# Patient Record
Sex: Male | Born: 1965 | Race: White | Hispanic: No | Marital: Married
Health system: Southern US, Community
[De-identification: ages and names within clinical notes are randomized; demographics above are authoritative.]

---

## 2006-10-30 LAB — CONVERTED CEMR LAB
Cholesterol: 234 mg/dL
HDL: 45 mg/dL
LDL Cholesterol: 160 mg/dL
Triglycerides: 145 mg/dL

## 2006-11-24 ENCOUNTER — Encounter: Payer: Self-pay | Admitting: Family Medicine

## 2006-11-25 ENCOUNTER — Ambulatory Visit: Payer: Self-pay | Admitting: Family Medicine

## 2006-11-25 DIAGNOSIS — E785 Hyperlipidemia, unspecified: Secondary | ICD-10-CM | POA: Insufficient documentation

## 2006-11-25 DIAGNOSIS — G4709 Other insomnia: Secondary | ICD-10-CM | POA: Insufficient documentation

## 2006-11-25 DIAGNOSIS — R9431 Abnormal electrocardiogram [ECG] [EKG]: Secondary | ICD-10-CM | POA: Insufficient documentation

## 2006-11-25 LAB — CONVERTED CEMR LAB
Cholesterol, target level: 200 mg/dL
LDL Goal: 160 mg/dL

## 2007-11-12 ENCOUNTER — Ambulatory Visit: Payer: Self-pay | Admitting: Occupational Medicine

## 2007-11-16 ENCOUNTER — Telehealth (INDEPENDENT_AMBULATORY_CARE_PROVIDER_SITE_OTHER): Payer: Self-pay | Admitting: Occupational Medicine

## 2007-11-19 ENCOUNTER — Telehealth (INDEPENDENT_AMBULATORY_CARE_PROVIDER_SITE_OTHER): Payer: Self-pay | Admitting: Occupational Medicine

## 2007-11-19 ENCOUNTER — Ambulatory Visit: Payer: Self-pay | Admitting: Occupational Medicine

## 2008-07-16 ENCOUNTER — Ambulatory Visit: Payer: Self-pay | Admitting: Family Medicine

## 2008-07-16 DIAGNOSIS — S335XXA Sprain of ligaments of lumbar spine, initial encounter: Secondary | ICD-10-CM

## 2008-07-16 DIAGNOSIS — S339XXA Sprain of unspecified parts of lumbar spine and pelvis, initial encounter: Secondary | ICD-10-CM | POA: Insufficient documentation

## 2008-07-20 ENCOUNTER — Telehealth (INDEPENDENT_AMBULATORY_CARE_PROVIDER_SITE_OTHER): Payer: Self-pay

## 2008-07-20 ENCOUNTER — Ambulatory Visit: Payer: Self-pay | Admitting: Family Medicine

## 2010-01-04 ENCOUNTER — Encounter: Admission: RE | Admit: 2010-01-04 | Discharge: 2010-01-04 | Payer: Self-pay | Admitting: Emergency Medicine

## 2010-10-03 ENCOUNTER — Encounter: Payer: Self-pay | Admitting: Emergency Medicine

## 2010-10-03 ENCOUNTER — Other Ambulatory Visit: Payer: Self-pay | Admitting: Emergency Medicine

## 2010-10-03 ENCOUNTER — Ambulatory Visit
Admission: RE | Admit: 2010-10-03 | Discharge: 2010-10-03 | Disposition: A | Discharge status: Home / Self Care | Payer: 59 | Source: Ambulatory Visit | Attending: Emergency Medicine | Admitting: Emergency Medicine

## 2010-10-03 ENCOUNTER — Inpatient Hospital Stay (INDEPENDENT_AMBULATORY_CARE_PROVIDER_SITE_OTHER)
Admission: RE | Admit: 2010-10-03 | Discharge: 2010-10-03 | Disposition: A | Discharge status: Home / Self Care | Payer: 59 | Source: Ambulatory Visit | Attending: Emergency Medicine | Admitting: Emergency Medicine

## 2010-10-03 DIAGNOSIS — M545 Low back pain, unspecified: Secondary | ICD-10-CM

## 2010-10-03 DIAGNOSIS — F329 Major depressive disorder, single episode, unspecified: Secondary | ICD-10-CM | POA: Insufficient documentation

## 2010-10-03 DIAGNOSIS — F3289 Other specified depressive episodes: Secondary | ICD-10-CM | POA: Insufficient documentation

## 2010-11-05 ENCOUNTER — Encounter: Payer: Self-pay | Admitting: Emergency Medicine

## 2010-11-05 ENCOUNTER — Inpatient Hospital Stay (INDEPENDENT_AMBULATORY_CARE_PROVIDER_SITE_OTHER)
Admission: RE | Admit: 2010-11-05 | Discharge: 2010-11-05 | Disposition: A | Discharge status: Home / Self Care | Payer: 59 | Source: Ambulatory Visit | Attending: Emergency Medicine | Admitting: Emergency Medicine

## 2010-11-05 DIAGNOSIS — N390 Urinary tract infection, site not specified: Secondary | ICD-10-CM

## 2010-11-05 LAB — CONVERTED CEMR LAB
Protein, U semiquant: NEGATIVE
Urobilinogen, UA: 0.2
WBC Urine, dipstick: NEGATIVE

## 2010-11-07 ENCOUNTER — Telehealth (INDEPENDENT_AMBULATORY_CARE_PROVIDER_SITE_OTHER): Payer: Self-pay | Admitting: *Deleted

## 2010-11-10 ENCOUNTER — Encounter: Payer: Self-pay | Admitting: Emergency Medicine

## 2010-11-10 ENCOUNTER — Inpatient Hospital Stay (INDEPENDENT_AMBULATORY_CARE_PROVIDER_SITE_OTHER)
Admission: RE | Admit: 2010-11-10 | Discharge: 2010-11-10 | Disposition: A | Discharge status: Home / Self Care | Payer: 59 | Source: Ambulatory Visit | Attending: Emergency Medicine | Admitting: Emergency Medicine

## 2010-11-10 DIAGNOSIS — R300 Dysuria: Secondary | ICD-10-CM

## 2010-11-10 LAB — CONVERTED CEMR LAB
Bilirubin Urine: NEGATIVE
Blood in Urine, dipstick: NEGATIVE
Specific Gravity, Urine: 1.025
pH: 6

## 2010-11-12 ENCOUNTER — Telehealth (INDEPENDENT_AMBULATORY_CARE_PROVIDER_SITE_OTHER): Payer: Self-pay | Admitting: *Deleted

## 2011-02-03 NOTE — Telephone Encounter (Signed)
  Phone Note Outgoing Call Call back at [facility] Phone [phone]   Call placed by: Lajean Saver RN,  November 12, 2010 3:20 PM Call placed to: Patient Summary of Call: Callback: No answer. Message left to call back for lab results

## 2011-02-03 NOTE — Progress Notes (Signed)
Summary: LBP Room 5   Vital Signs:  Patient Profile:   45 Years Old Male CC:      Lower back injury this am, after lifting something heavy Height:     72.5 inches Weight:      209 pounds O2 Sat:      99 % O2 treatment:    Room Air Temp:     98.2 degrees F oral Pulse rate:   76 / minute Pulse rhythm:   regular Resp:     16 per minute BP sitting:   131 / 86  (left arm) Cuff size:   regular  Pt. in pain?   yes    Location:   lower back    Intensity:   8    Type:       sharp  Vitals Entered By: Emilio Math (October 03, 2010 1:49 PM)                   Current Allergies: ! PCNHistory of Present Illness Chief Complaint: Lower back injury this am, after lifting something heavy History of Present Illness: Pt complains of low back pain. No radiation, numbness, focal weakness, or incontinence. Onset: this am Description/Quality of Pain: sharp, stabbing Intensity of pain: 8 Modifying Factors:movement, twisting make it worse Trauma: None directly Symptoms Better with: keeping still.  PMH: + Bulging thoracic disk, tx'd with steroid injections by his back/pain specialist, Dr Ardell Isaacs in 2011. He denies needing any injections or taking any narcotic rx pain med for over 1 year . No hx of lumbar back pain problems needing medical tx.  REVIEW OF SYSTEMS Constitutional Symptoms      Denies fever, chills, night sweats, weight loss, weight gain, and fatigue.  Eyes       Denies change in vision, eye pain, eye discharge, glasses, contact lenses, and eye surgery. Ear/Nose/Throat/Mouth       Denies hearing loss/aids, change in hearing, ear pain, ear discharge, dizziness, frequent runny nose, frequent nose bleeds, sinus problems, sore throat, hoarseness, and tooth pain or bleeding.  Respiratory       Denies dry cough, productive cough, wheezing, shortness of breath, asthma, bronchitis, and emphysema/COPD.  Cardiovascular       Denies murmurs, chest pain, and tires easily with  exhertion.    Gastrointestinal       Denies stomach pain, nausea/vomiting, diarrhea, constipation, blood in bowel movements, and indigestion. Genitourniary       Denies painful urination, kidney stones, and loss of urinary control. Neurological       Denies paralysis, seizures, and fainting/blackouts. Musculoskeletal       Complains of muscle pain, joint pain, joint stiffness, and decreased range of motion.      Denies redness, swelling, muscle weakness, and gout.  Skin       Denies bruising, unusual mles/lumps or sores, and hair/skin or nail changes.  Psych       Denies mood changes, temper/anger issues, anxiety/stress, speech problems, depression, and sleep problems.  Past History:  Past Medical History: None Depression  Past Surgical History: Reviewed history from 11/25/2006 and no changes required. Tonsillecotmy 1971, Appendectomy 1982  Family History: Reviewed history from 11/25/2006 and no changes required. 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  Never Smoked Alcohol  use-yes Drug use-no Regular exercise-yes Physical Exam General appearance: Very uncomfortable, standing, splinting himself. Cooperative, pleasant male. Neurological: grossly intact and non-focal. Motor and sensory intact and = bilat. Back: tender musculature and L-S spine midline  bilateral lower back; straight leg raises equivically + at 45 degrees biilaterally; deep tendon reflexes 2+ at achilles and patella Skin: no obvious rashes or lesions Assessment New Problems: LOW BACK PAIN, ACUTE (ICD-724.2) DEPRESSION (ICD-311)  Acute low back pain. 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 Although no clinical evidence of neuro deficit, I cannot r/o discogenic  cause for the acute pain.  Plan New Medications/Changes: VICODIN 5-500 MG TABS (HYDROCODONE-ACETAMINOPHEN) 1 by mouth q 4-6 hrs as needed severe pain  #15 x 0, 10/04/2010, Lajean Manes MD TRAMADOL HCL 50 MG TABS (TRAMADOL HCL) 1 by mouth two times a day as needed pain  #20 x 0, 10/04/2010, Lajean Manes MD  New Orders: T-DG Lumbar Spine Complete 5 views [72110] Est. Patient Level IV [69629] Planning Comments:   Discussed options with pt. He declined started by mouth steroids right now. Rx's now for Tramadoladol and Vicodin for acute pain. Precautions discussed.  Follow Up: tomorrow with Dr Ardell Isaacs, MD, his back specialist Work Excuse: written. No work 8/3-8/6.  The patient and/or caregiver has been counseled thoroughly with regard to medications prescribed including dosage, schedule, interactions, rationale for use, and possible side effects and they verbalize understanding.  Diagnoses and expected course of recovery discussed and will return if not improved as expected or if the condition worsens. Patient and/or caregiver verbalized understanding.  Prescriptions: VICODIN 5-500 MG TABS (HYDROCODONE-ACETAMINOPHEN) 1 by mouth q 4-6 hrs as needed severe pain  #15 x 0   Entered and Authorized by:   Lajean Manes MD   Signed by:   Lajean Manes MD on 10/04/2010   Method used:   Handwritten   RxID:   [id number] TRAMADOL HCL 50 MG TABS (TRAMADOL HCL) 1 by mouth two times a day as needed pain  #20 x 0   Entered and Authorized by:   Lajean Manes MD   Signed by:   Lajean Manes MD on 10/04/2010   Method used:   Handwritten   RxID:   [id number]   Orders Added: 1)  T-DG Lumbar Spine Complete 5 views [72110] 2)  Est. Patient Level IV [87564]

## 2011-02-03 NOTE — Progress Notes (Signed)
Summary: f/u Uti/TM   Vital Signs:  Patient Profile:   45 Years Old Male CC:      PAINFUL URINATION Height:     72.5 inches Weight:      210 pounds O2 Sat:      95 % O2 treatment:    Room Air Temp:     98.1 degrees F rectal Pulse rate:   74 / minute Resp:     18 per minute BP sitting:   120 / 79  (left arm) Cuff size:   large  Vitals Entered By: Linton Flemings RN (November 10, 2010 1:49 PM)                  Updated Prior Medication List: ABILIFY 2 MG TABS (ARIPIPRAZOLE) once daily PROZAC 10 MG CAPS (FLUOXETINE HCL)  CIPROFLOXACIN HCL 500 MG TABS (CIPROFLOXACIN HCL) 1 by mouth two times a day for 7 days  Current Allergies (reviewed today): ! PCNHistory of Present Illness Chief Complaint: PAINFUL URINATION History of Present Illness: He was here a few days ago, tx w/ Cipro for UTI.  Culture came back negative.  Still with irritation and burning with irritation.  Not sexually active, no h/o STDs.  No rash or lesion.  No F/C.  No hematuria.  He does have a history of stones.  REVIEW OF SYSTEMS Constitutional Symptoms      Denies fever, chills, night sweats, weight loss, weight gain, and fatigue.  Eyes       Denies change in vision, eye pain, eye discharge, glasses, contact lenses, and eye surgery. Ear/Nose/Throat/Mouth       Denies hearing loss/aids, change in hearing, ear pain, ear discharge, dizziness, frequent runny nose, frequent nose bleeds, sinus problems, sore throat, hoarseness, and tooth pain or bleeding.  Respiratory       Denies dry cough, productive cough, wheezing, shortness of breath, asthma, bronchitis, and emphysema/COPD.  Cardiovascular       Denies murmurs, chest pain, and tires easily with exhertion.    Gastrointestinal       Denies stomach pain, nausea/vomiting, diarrhea, constipation, blood in bowel movements, and indigestion. Genitourniary       Complains of painful urination.      Denies kidney stones and loss of urinary control. Neurological      Denies paralysis, seizures, and fainting/blackouts. Musculoskeletal       Denies muscle pain, joint pain, joint stiffness, decreased range of motion, redness, swelling, muscle weakness, and gout.  Skin       Denies bruising, unusual mles/lumps or sores, and hair/skin or nail changes.  Psych       Denies mood changes, temper/anger issues, anxiety/stress, speech problems, depression, and sleep problems. Other Comments: CURRENTLY BEING TREATED W/ABT FOR UTI STATES HE HAS ONE DAY LEFT W/OUT RELIEF   Past History:  Past Medical History: Reviewed history from 10/03/2010 and no changes required. None Depression  Past Surgical History: Reviewed history from 11/25/2006 and no changes required. Tonsillecotmy 1971, Appendectomy 1982  Family History: Reviewed history from 11/25/2006 and no changes required. 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  Never Smoked Alcohol use-yes Drug use-no Regular exercise-yes Physical Exam General appearance: well developed, well nourished, no acute distress GU: deferred MSE: oriented to time, place, and person Assessment New  Problems: DYSURIA (ICD-788.1)   Plan New Medications/Changes: FLUCONAZOLE 150 MG TABS (FLUCONAZOLE) 1 by mouth x1, repeat in 2 days  #2 x 0, 11/10/2010, Hoyt Koch MD PHENAZOPYRIDINE HCL 200 MG TABS (PHENAZOPYRIDINE HCL) 1 by mouth three times a day for 2 days  #6 x 0, 11/10/2010, Hoyt Koch MD  New Orders: T-Chlamydia & GC Probe, Urine [[id number]] Est. Patient Level III [16109] UA Dipstick w/o Micro (automated)  [81003] Planning Comments:   Finish the Cipro.  Will check urine for GC & Chl.  Treat with Diflucan if it is a yeast urethritis.  Also with 2 days of Azo.  Hydration encouraged.   Will call him in 2-3 days.  If not improving, needs to see urology.   The patient and/or caregiver has been counseled thoroughly with regard to medications prescribed including dosage, schedule, interactions, rationale for use, and possible side effects and they verbalize understanding.  Diagnoses and expected course of recovery discussed and will return if not improved as expected or if the condition worsens. Patient and/or caregiver verbalized understanding.  Prescriptions: FLUCONAZOLE 150 MG TABS (FLUCONAZOLE) 1 by mouth x1, repeat in 2 days  #2 x 0   Entered and Authorized by:   Hoyt Koch MD   Signed by:   Hoyt Koch MD on 11/10/2010   Method used:   Print then Give to Patient   RxID:   [id number] PHENAZOPYRIDINE HCL 200 MG TABS (PHENAZOPYRIDINE HCL) 1 by mouth three times a day for 2 days  #6 x 0   Entered and Authorized by:   Hoyt Koch MD   Signed by:   Hoyt Koch MD on 11/10/2010   Method used:   Print then Give to Patient   RxID:   [id number]   Orders Added: 1)  T-Chlamydia & GC Probe, Urine [87491/[id number]] 2)  Est. Patient Level III [29528] 3)  UA Dipstick w/o Micro (automated)  [81003]    Laboratory Results   Urine Tests  Date/Time Received: November 10, 2010 1:56 PM  Date/Time Reported: November 10, 2010 1:56 PM   Routine Urinalysis   Color: orange Appearance: Clear Glucose: TRACE   (Normal Range: Negative) Bilirubin: negative   (Normal Range: Negative) Ketone: trace (5)   (Normal Range: Negative) Spec. Gravity: 1.025   (Normal Range: 1.003-1.035) Blood: negative   (Normal Range: Negative) pH: 6.0   (Normal Range: 5.0-8.0) Protein: 1+   (Normal Range: Negative) Urobilinogen: 1.0   (Normal Range: 0-1) Nitrite: positive   (Normal Range: Negative) Leukocyte Esterace: negative   (Normal Range: Negative)

## 2011-02-03 NOTE — Telephone Encounter (Signed)
  Phone Note Outgoing Call   Call placed by: Clemens Catholic LPN,  November 07, 2010 1:00 PM Call placed to: Patient Summary of Call: call back: left message with lab results and to call back if he has any questions or concerns. Initial call taken by: Clemens Catholic LPN,  November 07, 2010 1:00 PM

## 2011-02-03 NOTE — Progress Notes (Signed)
Summary: Possible UTI? rm 4   Vital Signs:  Patient Profile:   45 Years Old Male CC:      ?UTI x 1wk  Height:     72.5 inches Weight:      209.25 pounds O2 Sat:      97 % O2 treatment:    Room Air Temp:     98.3 degrees F oral Pulse rate:   67 / minute Resp:     16 per minute BP sitting:   130 / 82  (left arm) Cuff size:   regular  Vitals Entered By: James Walters (November 05, 2010 10:09 AM)                  Updated Prior Medication List: ABILIFY 2 MG TABS (ARIPIPRAZOLE) once daily PROZAC 10 MG CAPS (FLUOXETINE HCL)   Current Allergies (reviewed today): ! PCNHistory of Present Illness Chief Complaint: ?UTI x 1wk  History of Present Illness: 46 Years Old Male complains of UTI symptoms for 1 week.  He describes the pain as burning during urination.  He has used Azo which helps. + dysuria + frequency + urgency No hematuria No fever/chills No lower abdomenal pain No back pain No fatigue  He is separated and not sexually active.  He has a history of kidney stones and occasionally gets symptoms like this.  REVIEW OF SYSTEMS Constitutional Symptoms      Denies fever, chills, night sweats, weight loss, weight gain, and fatigue.  Eyes       Complains of glasses.      Denies change in vision, eye pain, eye discharge, contact lenses, and eye surgery. Ear/Nose/Throat/Mouth       Denies hearing loss/aids, change in hearing, ear pain, ear discharge, dizziness, frequent runny nose, frequent nose bleeds, sinus problems, sore throat, hoarseness, and tooth pain or bleeding.  Respiratory       Denies dry cough, productive cough, wheezing, shortness of breath, asthma, bronchitis, and emphysema/COPD.  Cardiovascular       Denies murmurs, chest pain, and tires easily with exhertion.    Gastrointestinal       Denies stomach pain, nausea/vomiting, diarrhea, constipation, blood in bowel movements, and indigestion. Genitourniary       Complains of painful urination.       Denies kidney stones and loss of urinary control. Neurological       Denies paralysis, seizures, and fainting/blackouts. Musculoskeletal       Denies muscle pain, joint pain, joint stiffness, decreased range of motion, redness, swelling, muscle weakness, and gout.  Skin       Denies bruising, unusual mles/lumps or sores, and hair/skin or nail changes.  Psych       Denies mood changes, temper/anger issues, anxiety/stress, speech problems, depression, and sleep problems. Other Comments: pt c/o freq, burning and pain with urination x 1wk. no fever. he has taken AZO.   Past History:  Past Medical History: Reviewed history from 10/03/2010 and no changes required. None Depression  Past Surgical History: Reviewed history from 11/25/2006 and no changes required. Tonsillecotmy 1971, Appendectomy 1982  Family History: Reviewed history from 11/25/2006 and no changes required. 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  Never Smoked Alcohol use-yes Drug use-no Regular exercise-yes Physical Exam General appearance: well  developed, well nourished, no acute distress Abdomen: soft, non-tender without obvious organomegaly Back: no flank or cva tenderness MSE: oriented to time, place, and person Assessment New Problems: URINARY TRACT INFECTION (ICD-599.0)   Patient Education: Patient and/or caregiver instructed in the following: rest, fluids.  Plan New Medications/Changes: CIPROFLOXACIN HCL 500 MG TABS (CIPROFLOXACIN HCL) 1 by mouth two times a day for 7 days  #14 x 0, 11/05/2010, James Walters  New Orders: Est. Patient Level III [id number] T-Culture, Urine [[id number]] UA Dipstick w/o Micro (automated)  [81003] Planning Comments:   Urine culture pending.  Follow-up  with your primary care physician or urologist if not improving or if getting worse. This sounds like UTI.  If not going away, consider kidney stone.  I don't see any on his recent lumbar Xray from last month.   The patient and/or caregiver has been counseled thoroughly with regard to medications prescribed including dosage, schedule, interactions, rationale for use, and possible side effects and they verbalize understanding.  Diagnoses and expected course of recovery discussed and will return if not improved as expected or if the condition worsens. Patient and/or caregiver verbalized understanding.  Prescriptions: CIPROFLOXACIN HCL 500 MG TABS (CIPROFLOXACIN HCL) 1 by mouth two times a day for 7 days  #14 x 0   Entered and Authorized by:   James Walters   Signed by:   James Walters on 11/05/2010   Method used:   Print then Give to Patient   RxID:   [id number]   Orders Added: 1)  Est. Patient Level III [78469] 2)  T-Culture, Urine [[id number]] 3)  UA Dipstick w/o Micro (automated)  [81003]    Laboratory Results   Urine Tests  Date/Time Received: November 05, 2010 10:21 AM  Date/Time Reported: November 05, 2010 10:21 AM   Routine Urinalysis   Color: orange Appearance: Clear Glucose: negative   (Normal Range: Negative) Bilirubin: negative   (Normal Range: Negative) Ketone: negative   (Normal Range: Negative) Spec. Gravity: 1.015   (Normal Range: 1.003-1.035) Blood: trace-intact   (Normal Range: Negative) pH: 7.5   (Normal Range: 5.0-8.0) Protein: negative   (Normal Range: Negative) Urobilinogen: 0.2   (Normal Range: 0-1) Nitrite: positive   (Normal Range: Negative) Leukocyte Esterace: negative   (Normal Range: Negative)

## 2011-06-10 ENCOUNTER — Other Ambulatory Visit: Payer: Self-pay | Admitting: Occupational Medicine

## 2011-06-10 ENCOUNTER — Ambulatory Visit
Admission: RE | Admit: 2011-06-10 | Discharge: 2011-06-10 | Disposition: A | Discharge status: Home / Self Care | Payer: No Typology Code available for payment source | Source: Ambulatory Visit | Attending: Occupational Medicine | Admitting: Occupational Medicine

## 2011-06-10 DIAGNOSIS — A159 Respiratory tuberculosis unspecified: Secondary | ICD-10-CM

## 2011-10-21 IMAGING — CR DG CHEST 1V
1 series · 1 of 1 positions shown · non-contrast
Comparison: 11/19/2007

CLINICAL DATA: Positive TB skin test

CHEST - 1 VIEW

[view not recorded]
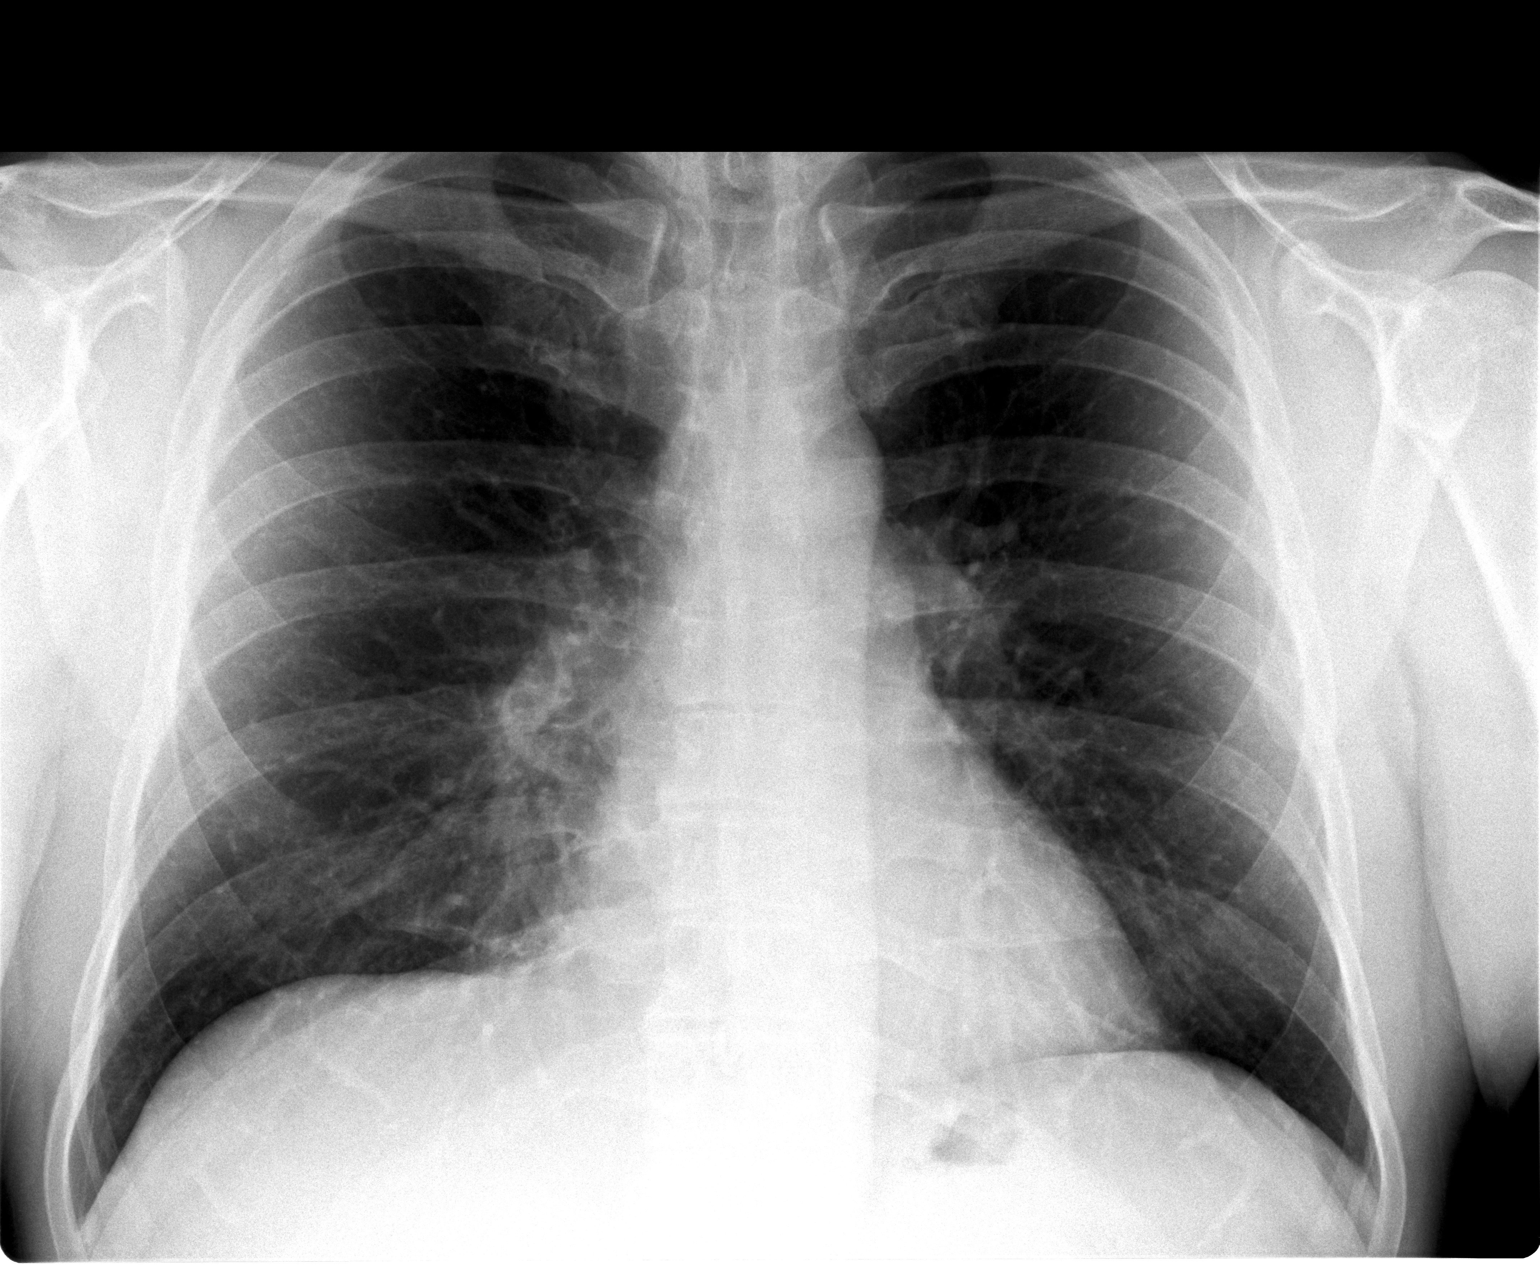

[1 of 1 positions shown; findings below may reference images not displayed]

FINDINGS: Heart size is normal and the vascularity is normal.
Negative for infiltrate or effusion.

Negative for active TB
IMPRESSION: No acute cardiopulmonary disease.

## 2012-05-20 ENCOUNTER — Ambulatory Visit (INDEPENDENT_AMBULATORY_CARE_PROVIDER_SITE_OTHER): Payer: Self-pay

## 2012-05-20 ENCOUNTER — Other Ambulatory Visit: Payer: Self-pay | Admitting: Emergency Medicine

## 2012-05-20 DIAGNOSIS — Z9289 Personal history of other medical treatment: Secondary | ICD-10-CM

## 2012-05-20 DIAGNOSIS — R7611 Nonspecific reaction to tuberculin skin test without active tuberculosis: Secondary | ICD-10-CM

## 2013-06-24 ENCOUNTER — Other Ambulatory Visit: Payer: Self-pay | Admitting: Emergency Medicine

## 2013-06-24 ENCOUNTER — Ambulatory Visit (HOSPITAL_BASED_OUTPATIENT_CLINIC_OR_DEPARTMENT_OTHER): Payer: No Typology Code available for payment source

## 2013-06-24 ENCOUNTER — Ambulatory Visit (INDEPENDENT_AMBULATORY_CARE_PROVIDER_SITE_OTHER): Payer: Self-pay

## 2013-06-24 DIAGNOSIS — Z0000 Encounter for general adult medical examination without abnormal findings: Secondary | ICD-10-CM

## 2014-03-06 IMAGING — CR DG CHEST 1V
1 series · 1 of 1 positions shown · non-contrast
Comparison: 06/10/2011

CLINICAL DATA: Positive PPD.

CHEST - 1 VIEW

[view not recorded]
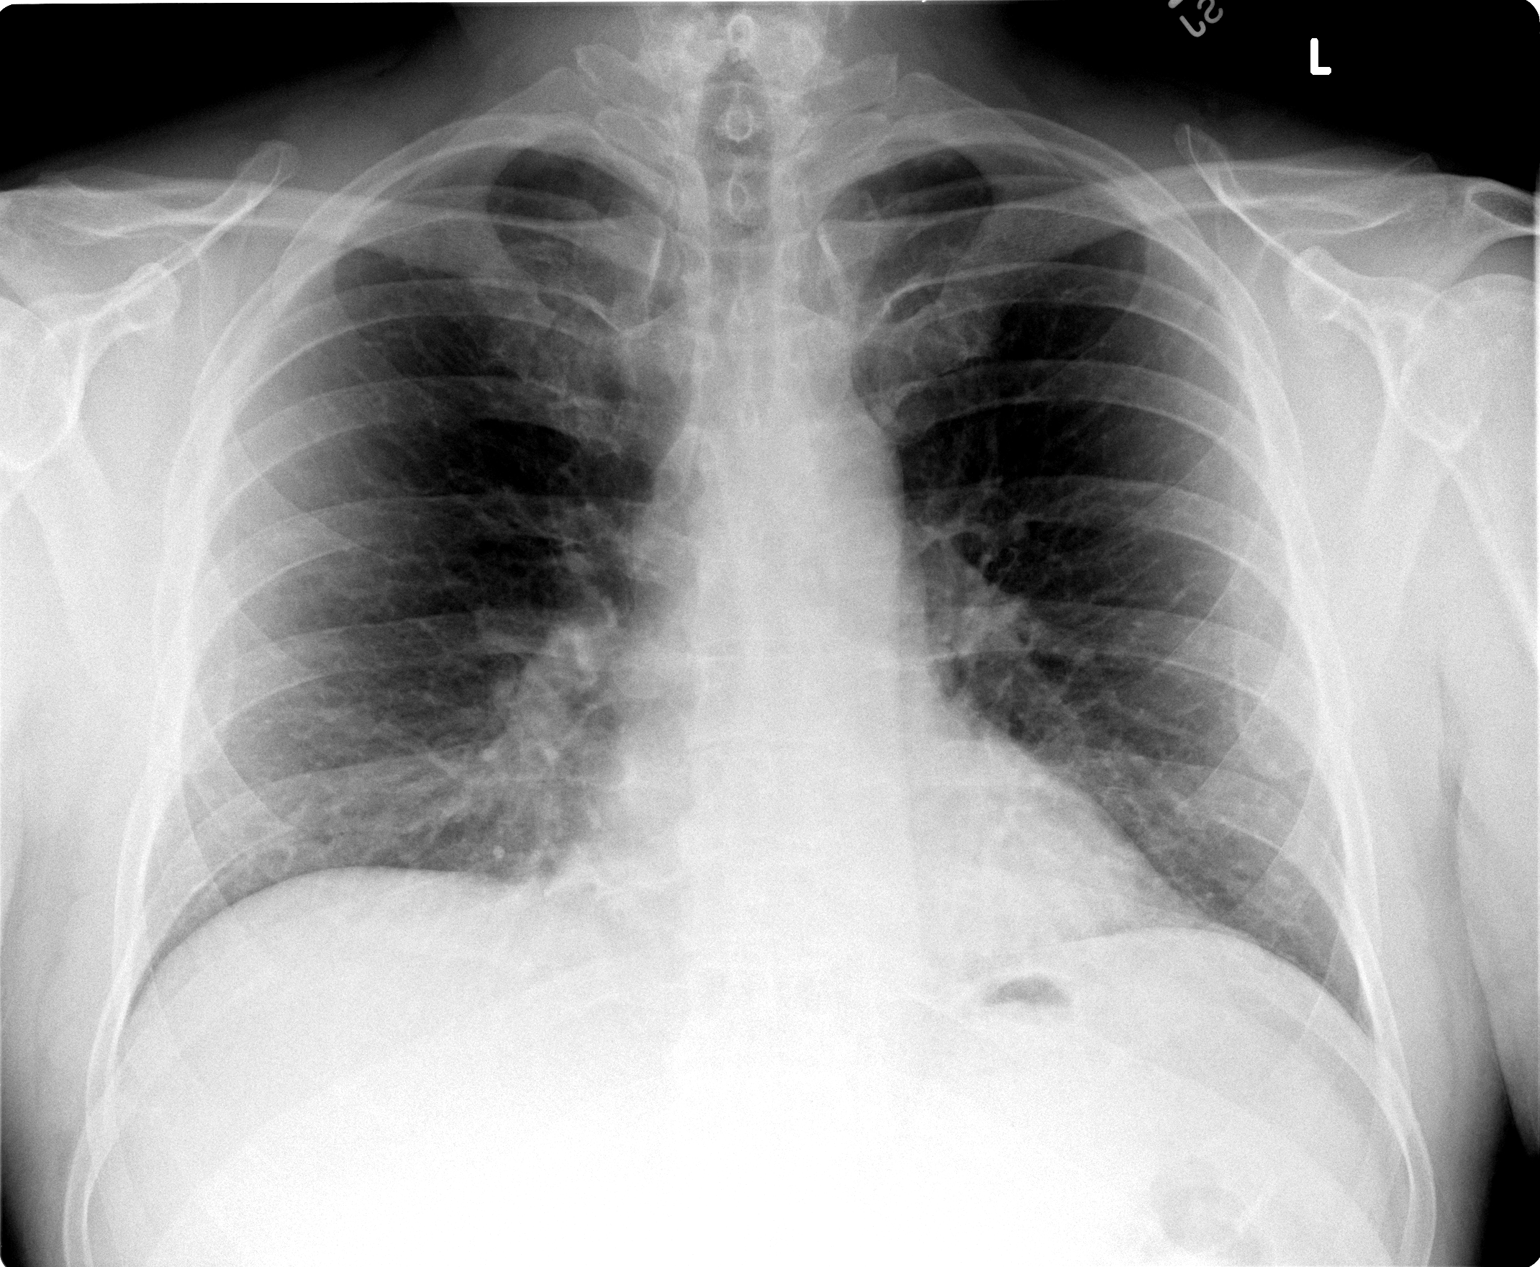

[1 of 1 positions shown; findings below may reference images not displayed]

FINDINGS: Low lung volumes are present, causing crowding of the
pulmonary vasculature.  Cardiac and mediastinal contours appear
normal.

The lungs appear clear.

No pleural effusion is identified.
IMPRESSION: 1.  No significant abnormality identified.  No findings of active
pulmonary tuberculosis.

## 2014-09-26 ENCOUNTER — Other Ambulatory Visit: Payer: Self-pay | Admitting: Adult Health

## 2014-09-26 ENCOUNTER — Ambulatory Visit (INDEPENDENT_AMBULATORY_CARE_PROVIDER_SITE_OTHER): Payer: Self-pay

## 2014-09-26 DIAGNOSIS — Z0000 Encounter for general adult medical examination without abnormal findings: Secondary | ICD-10-CM

## 2015-04-10 IMAGING — CR DG CHEST 1V
1 series · 1 of 1 positions shown · non-contrast
Comparison: 05/21/1998

CLINICAL DATA: Annual physical

EXAM:
CHEST - 1 VIEW

[view not recorded]
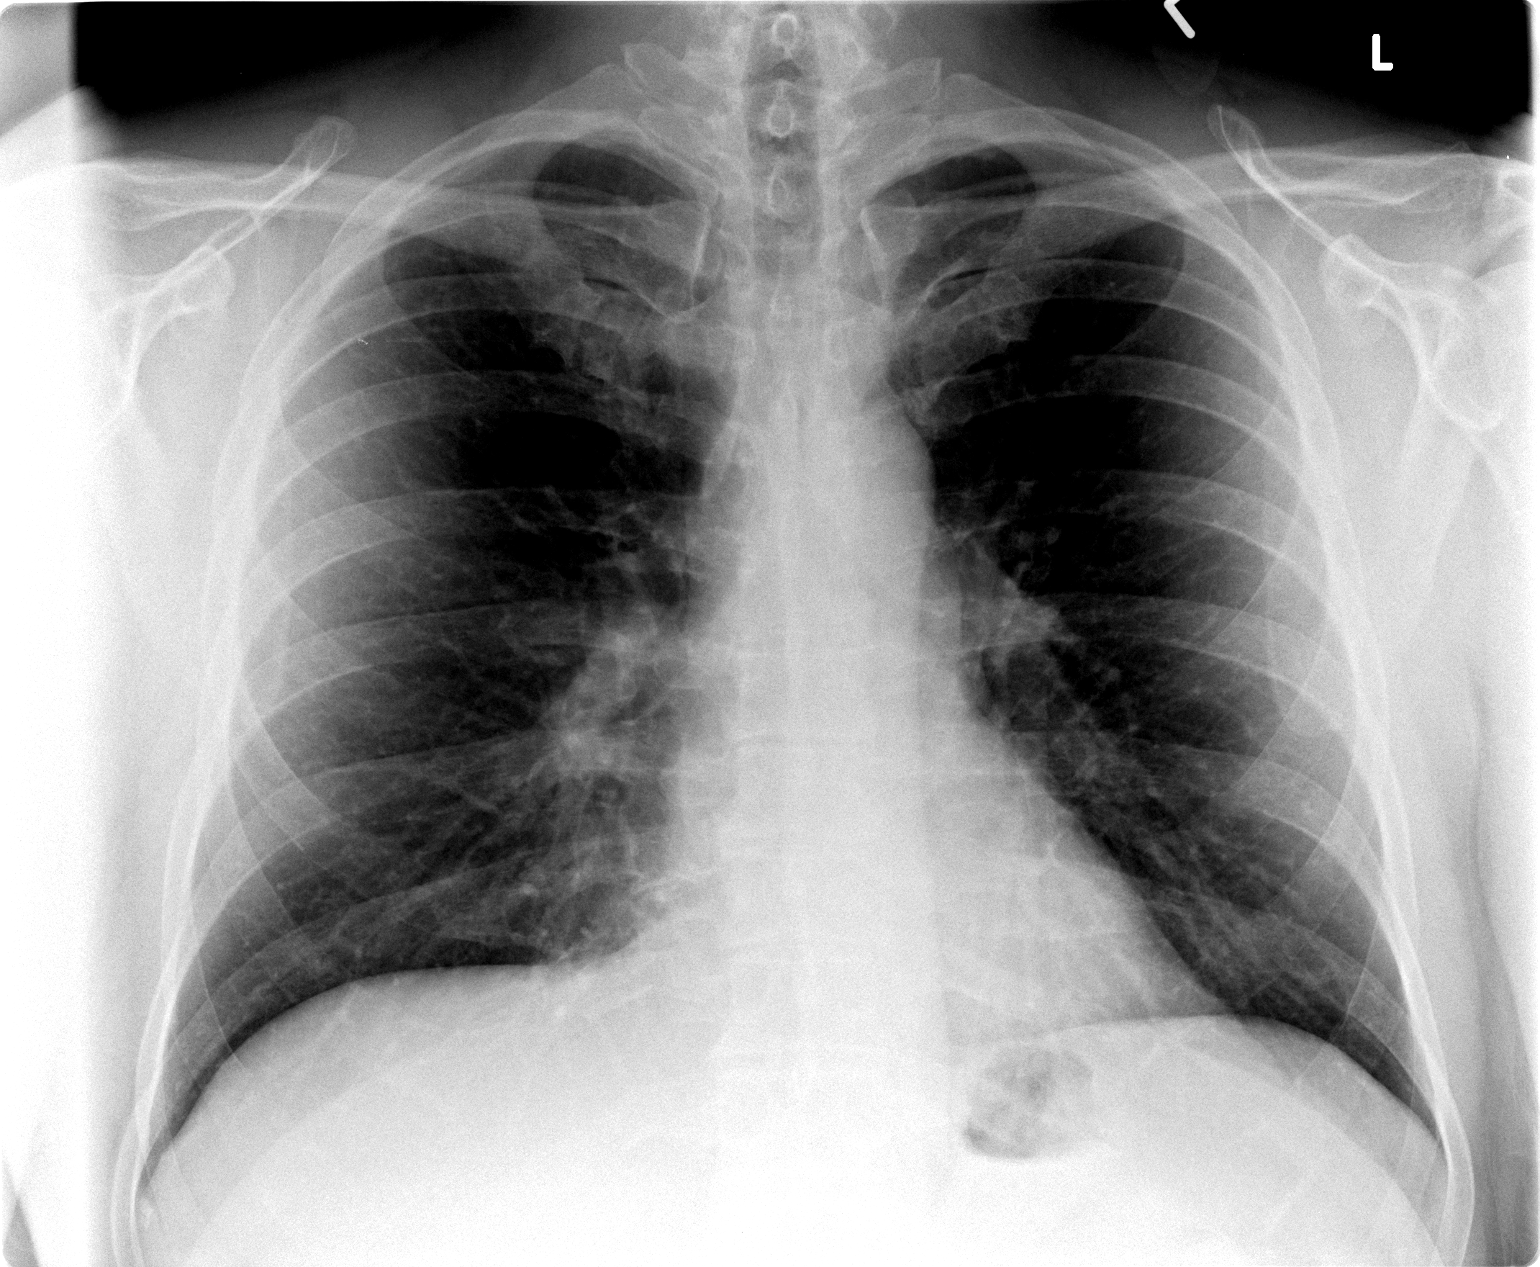

[1 of 1 positions shown; findings below may reference images not displayed]

FINDINGS: The heart size and mediastinal contours are within normal limits.
Both lungs are clear. The visualized skeletal structures are
unremarkable.
IMPRESSION: No active disease.

## 2016-07-12 IMAGING — CR DG CHEST 1V
1 series · 1 of 1 positions shown · non-contrast
Comparison: June 24, 2013

CLINICAL DATA: Tuberculosis screening.  Annual physical examination

EXAM:
CHEST  1 VIEW

[chest pa]
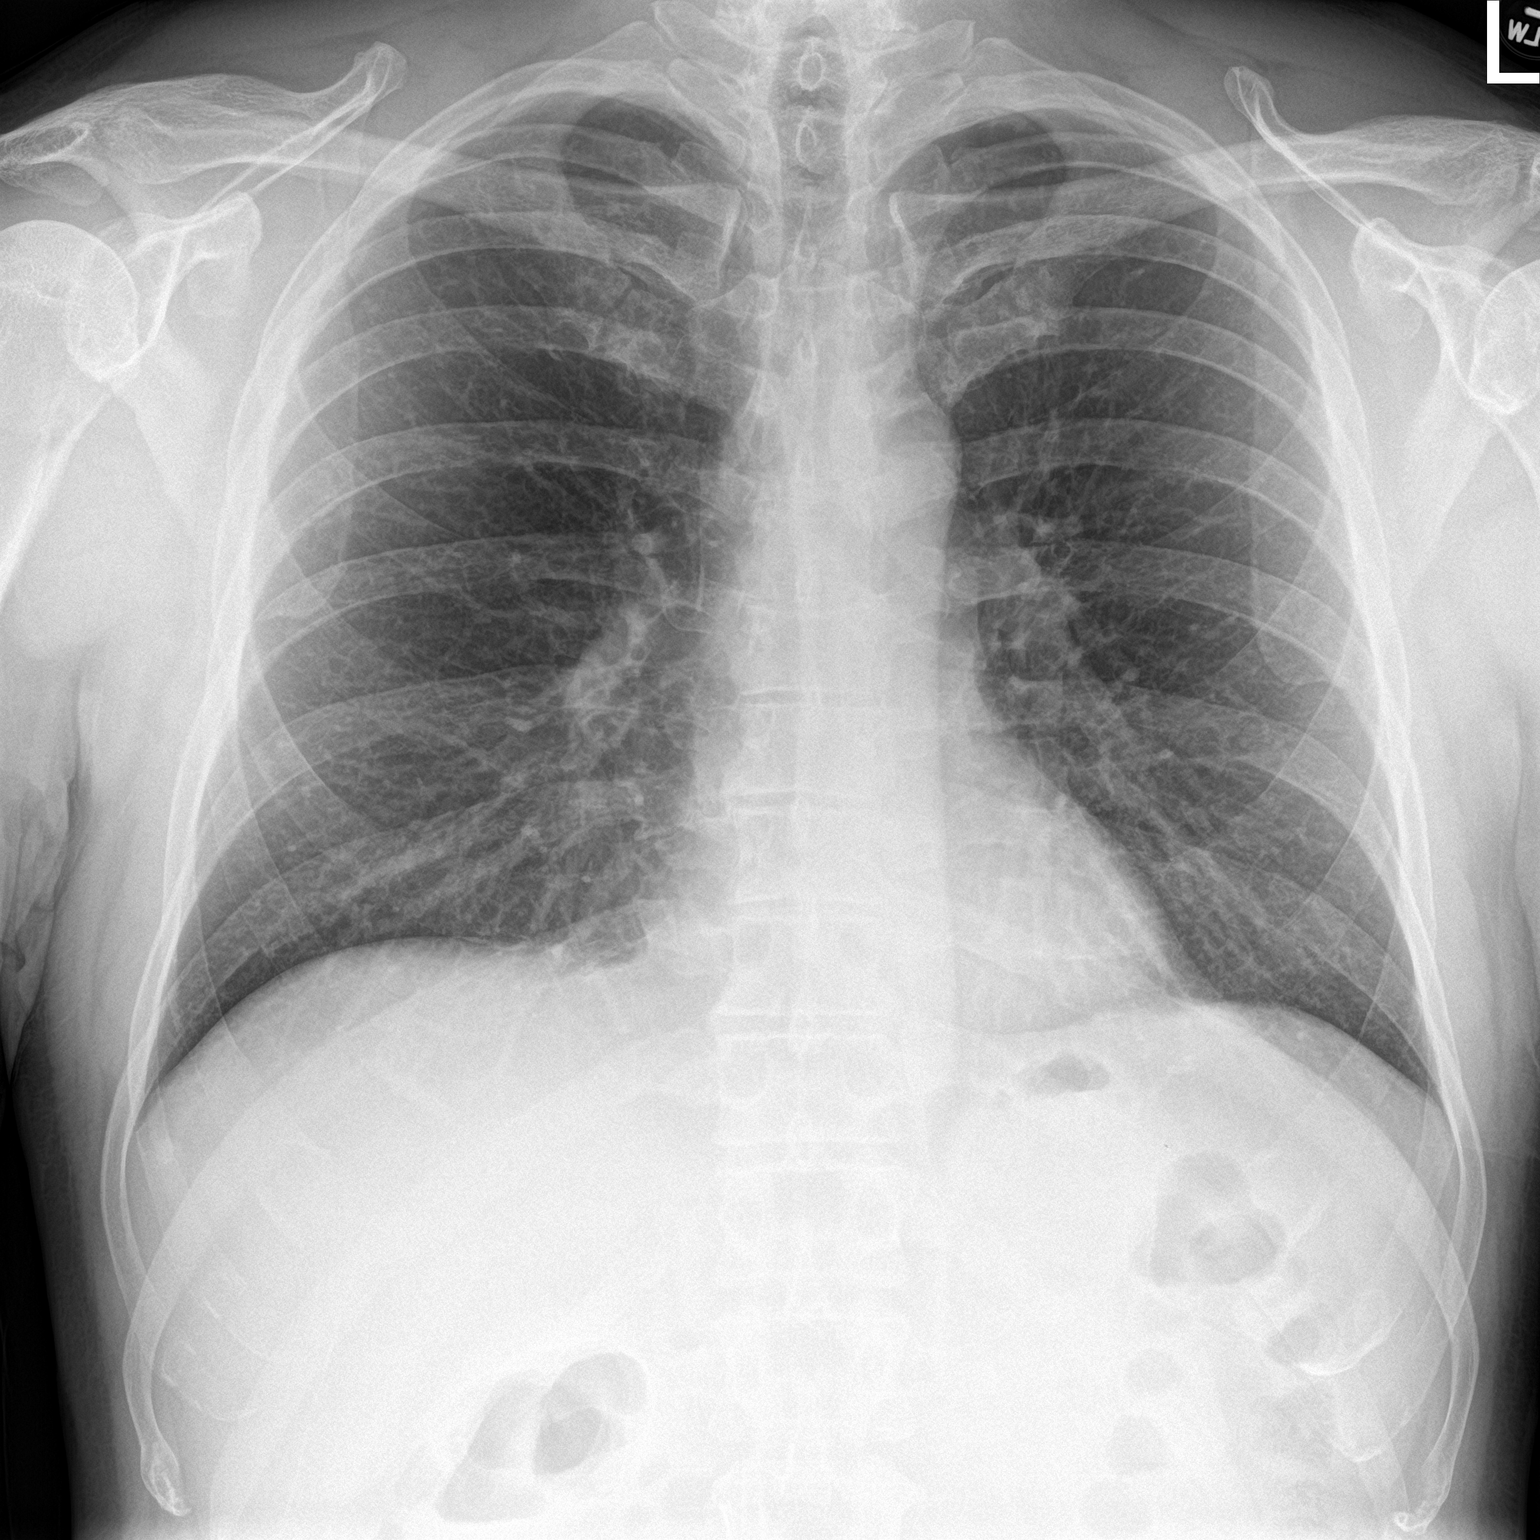

[1 of 1 positions shown; findings below may reference images not displayed]

FINDINGS: Lungs are clear. Heart size and pulmonary vascularity are normal. No
adenopathy. No bone lesions.
IMPRESSION: No abnormality noted.
